# Patient Record
Sex: Female | Born: 1965 | Race: White | Hispanic: No | Marital: Married | Smoking: Never smoker
Health system: Southern US, Community
[De-identification: ages and names within clinical notes are randomized; demographics above are authoritative.]

---

## 2004-06-20 ENCOUNTER — Other Ambulatory Visit: Admission: RE | Admit: 2004-06-20 | Discharge: 2004-06-20 | Payer: Self-pay | Admitting: Gynecology

## 2005-07-29 ENCOUNTER — Other Ambulatory Visit: Admission: RE | Admit: 2005-07-29 | Discharge: 2005-07-29 | Payer: Self-pay | Admitting: Gynecology

## 2006-03-24 ENCOUNTER — Ambulatory Visit (HOSPITAL_COMMUNITY): Admission: RE | Admit: 2006-03-24 | Discharge: 2006-03-24 | Payer: Self-pay | Admitting: Gynecology

## 2007-03-29 ENCOUNTER — Other Ambulatory Visit: Admission: RE | Admit: 2007-03-29 | Discharge: 2007-03-29 | Payer: Self-pay | Admitting: Gynecology

## 2007-03-31 ENCOUNTER — Ambulatory Visit (HOSPITAL_COMMUNITY): Admission: RE | Admit: 2007-03-31 | Discharge: 2007-03-31 | Payer: Self-pay | Admitting: Gynecology

## 2008-03-31 ENCOUNTER — Ambulatory Visit (HOSPITAL_COMMUNITY): Admission: RE | Admit: 2008-03-31 | Discharge: 2008-03-31 | Payer: Self-pay | Admitting: Gynecology

## 2008-04-01 IMAGING — MG MM DIGITAL SCREENING BILAT
4 series · 4 of 4 positions shown · non-contrast
Comparison: none

DG SCREEN MAMMOGRAM BILATERAL
Bilateral CC and MLO view(s) were taken.

DIGITAL SCREENING MAMMOGRAM WITH CAD:
There is a fibroglandular pattern.  No masses or malignant type calcifications are identified.  
Compared with prior studies.

[R CC]
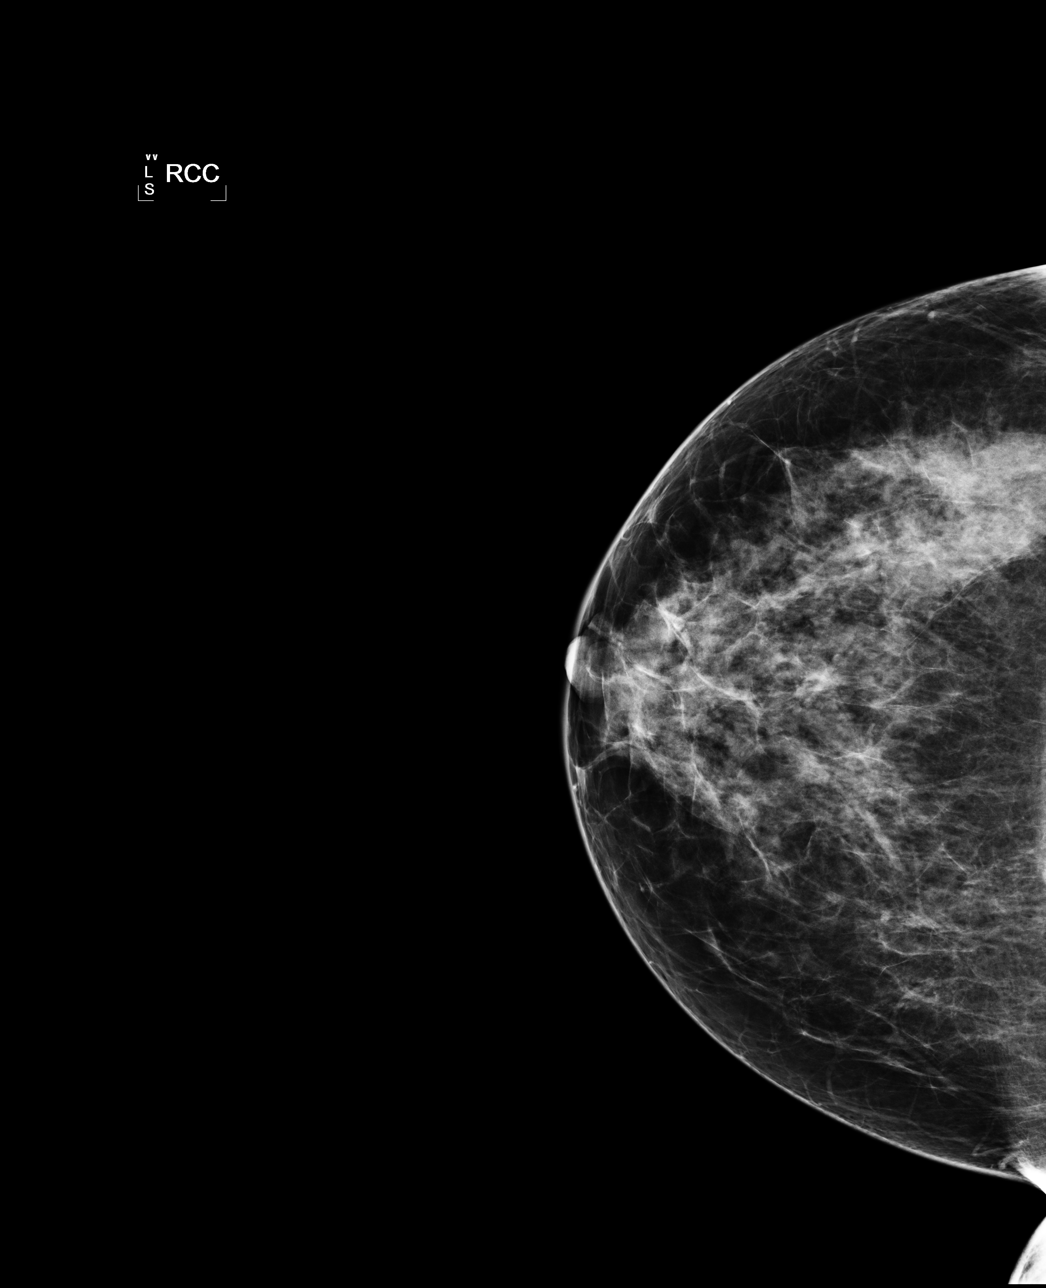

[R MLO]
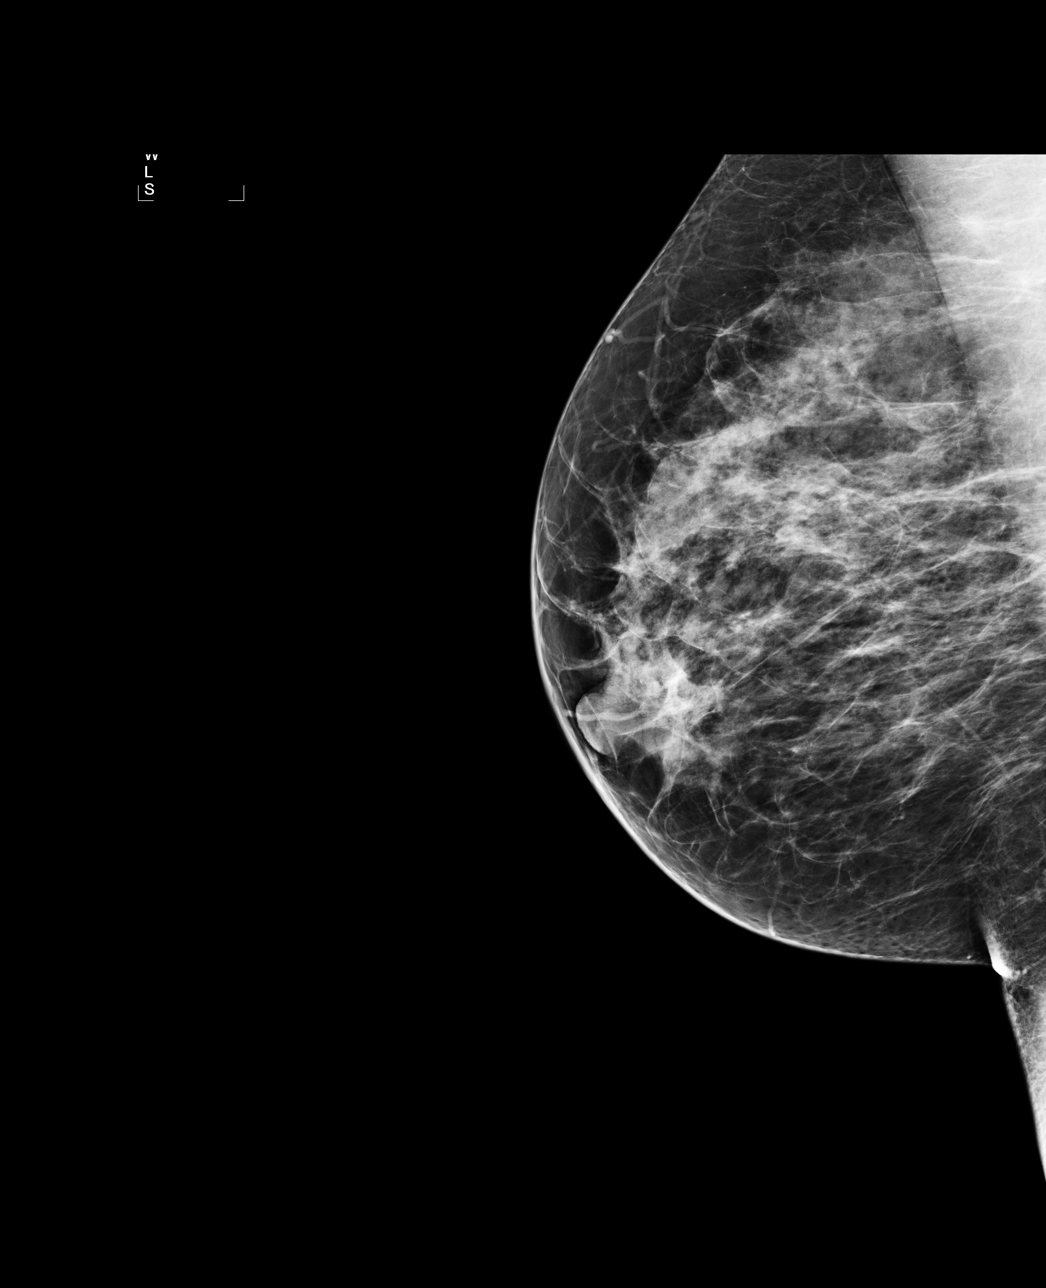

[L CC]
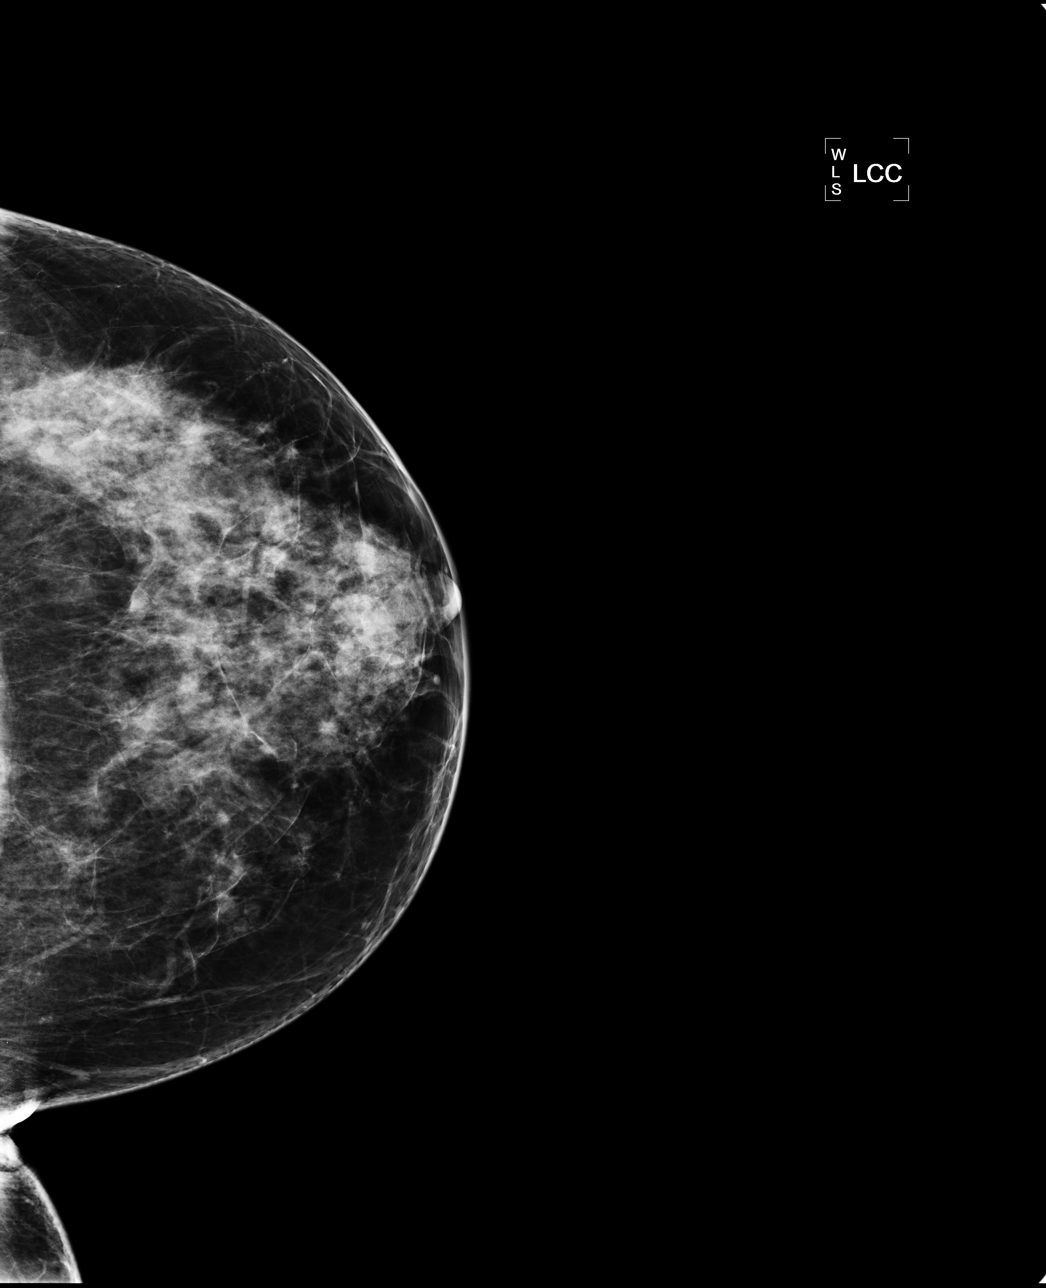

[L MLO]
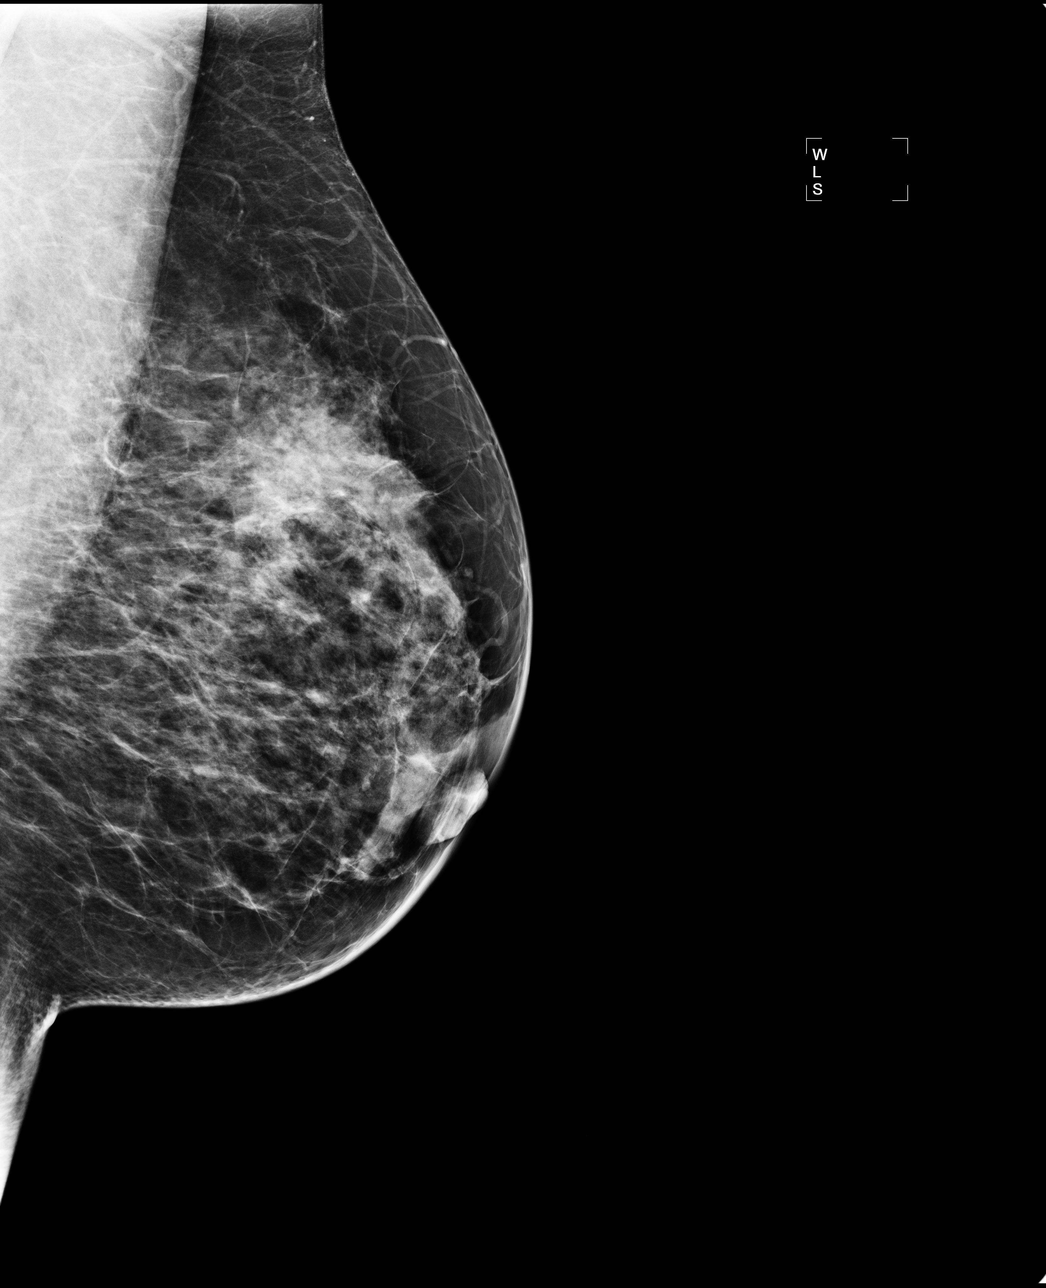

[4 of 4 positions shown; findings below may reference images not displayed]

IMPRESSION: No specific mammographic evidence of malignancy.  Next screening mammogram is recommended in one 
year.

ASSESSMENT: Negative - BI-RADS 1

Screening mammogram in 1 year.
ANALYZED BY COMPUTER AIDED DETECTION. , THIS PROCEDURE WAS A DIGITAL MAMMOGRAM.

## 2008-10-19 ENCOUNTER — Other Ambulatory Visit: Admission: RE | Admit: 2008-10-19 | Discharge: 2008-10-19 | Payer: Self-pay | Admitting: Gynecology

## 2009-04-05 ENCOUNTER — Ambulatory Visit (HOSPITAL_COMMUNITY): Admission: RE | Admit: 2009-04-05 | Discharge: 2009-04-05 | Payer: Self-pay | Admitting: Gynecology

## 2012-06-21 ENCOUNTER — Other Ambulatory Visit: Payer: Self-pay | Admitting: Gynecology

## 2012-06-21 DIAGNOSIS — R928 Other abnormal and inconclusive findings on diagnostic imaging of breast: Secondary | ICD-10-CM

## 2012-06-23 ENCOUNTER — Ambulatory Visit
Admission: RE | Admit: 2012-06-23 | Discharge: 2012-06-23 | Disposition: A | Discharge status: Home / Self Care | Payer: BC Managed Care – PPO | Source: Ambulatory Visit | Attending: Gynecology | Admitting: Gynecology

## 2012-06-23 DIAGNOSIS — R928 Other abnormal and inconclusive findings on diagnostic imaging of breast: Secondary | ICD-10-CM

## 2012-06-25 ENCOUNTER — Other Ambulatory Visit: Payer: Self-pay

## 2013-08-03 ENCOUNTER — Ambulatory Visit (INDEPENDENT_AMBULATORY_CARE_PROVIDER_SITE_OTHER): Payer: PRIVATE HEALTH INSURANCE | Admitting: General Surgery

## 2013-08-03 ENCOUNTER — Encounter (INDEPENDENT_AMBULATORY_CARE_PROVIDER_SITE_OTHER): Payer: Self-pay | Admitting: General Surgery

## 2013-08-03 VITALS — BP 120/76 | HR 77 | Temp 98.6°F | Resp 16 | Ht 60.0 in | Wt 139.6 lb

## 2013-08-03 DIAGNOSIS — K8020 Calculus of gallbladder without cholecystitis without obstruction: Secondary | ICD-10-CM

## 2013-08-03 NOTE — Progress Notes (Signed)
Subjective:     Patient ID: Kristin Barron, female   DOB: [birth year], 47 y.o.   MRN: [id number]  HPI The patient is a 47 year old female who is referred by Dr. Jacquelyne Balint. Patient is undergoing evaluation WBCs in her urine by her PCP. Patien was evaluated with a CT scan which revealed a dilated gallbladder and a gallstone.  The patient does describe she has left  Sided back pain.  She states she has no pain after eating. She states that fatty foods and not cause her to have right upper quadrant pain.  Review of Systems  Constitutional: Negative.   HENT: Negative.   Respiratory: Negative.   Cardiovascular: Negative.   Gastrointestinal: Negative.   All other systems reviewed and are negative.       Objective:   Physical Exam  Constitutional: She is oriented to person, place, and time. She appears well-developed and well-nourished.  HENT:  Head: Normocephalic and atraumatic.  Eyes: Conjunctivae and EOM are normal. Pupils are equal, round, and reactive to light.  Neck: Normal range of motion.  Cardiovascular: Normal rate, regular rhythm and normal heart sounds.   Pulmonary/Chest: Effort normal and breath sounds normal.  Abdominal: Soft. Bowel sounds are normal. There is tenderness in the right upper quadrant.  Musculoskeletal: Normal range of motion.  Neurological: She is alert and oriented to person, place, and time.       Assessment:     48 year old female with cholelithiasis     Plan:     1.  I do not believe that the patient's gallstones are causing any issues at this time. The patient has no symptoms of biliary colic. 2.  I Discussed with patient if  she has  pain  In her right upper quadrant after High fatty foods she can return for discussion of removing her gallbladder. I do not believe she needs at this time.

## 2013-08-15 ENCOUNTER — Encounter (INDEPENDENT_AMBULATORY_CARE_PROVIDER_SITE_OTHER): Payer: Self-pay

## 2014-04-18 ENCOUNTER — Other Ambulatory Visit: Payer: Self-pay

## 2014-04-18 DIAGNOSIS — Z1231 Encounter for screening mammogram for malignant neoplasm of breast: Secondary | ICD-10-CM

## 2014-06-28 ENCOUNTER — Ambulatory Visit
Admission: RE | Admit: 2014-06-28 | Discharge: 2014-06-28 | Disposition: A | Discharge status: Home / Self Care | Payer: PRIVATE HEALTH INSURANCE | Source: Ambulatory Visit

## 2014-06-28 DIAGNOSIS — Z1231 Encounter for screening mammogram for malignant neoplasm of breast: Secondary | ICD-10-CM

## 2019-04-29 ENCOUNTER — Other Ambulatory Visit: Payer: Self-pay

## 2019-04-29 ENCOUNTER — Other Ambulatory Visit: Payer: PRIVATE HEALTH INSURANCE

## 2019-04-29 DIAGNOSIS — Z20822 Contact with and (suspected) exposure to covid-19: Secondary | ICD-10-CM

## 2019-05-03 LAB — NOVEL CORONAVIRUS, NAA: SARS-CoV-2, NAA: NOT DETECTED

## 2019-05-04 ENCOUNTER — Telehealth: Payer: Self-pay

## 2019-05-04 NOTE — Telephone Encounter (Signed)
Pt returned call, advised of negative COVID19 results

## 2020-05-08 ENCOUNTER — Other Ambulatory Visit: Payer: Self-pay | Admitting: Obstetrics and Gynecology

## 2020-05-08 DIAGNOSIS — R928 Other abnormal and inconclusive findings on diagnostic imaging of breast: Secondary | ICD-10-CM

## 2020-05-18 ENCOUNTER — Ambulatory Visit
Admission: RE | Admit: 2020-05-18 | Discharge: 2020-05-18 | Disposition: A | Discharge status: Home / Self Care | Payer: PRIVATE HEALTH INSURANCE | Source: Ambulatory Visit | Attending: Obstetrics and Gynecology | Admitting: Obstetrics and Gynecology

## 2020-05-18 ENCOUNTER — Other Ambulatory Visit: Payer: Self-pay

## 2020-05-18 ENCOUNTER — Ambulatory Visit
Admission: RE | Admit: 2020-05-18 | Discharge: 2020-05-18 | Disposition: A | Discharge status: Home / Self Care | Payer: BC Managed Care – PPO | Source: Ambulatory Visit | Attending: Obstetrics and Gynecology | Admitting: Obstetrics and Gynecology

## 2020-05-18 DIAGNOSIS — R928 Other abnormal and inconclusive findings on diagnostic imaging of breast: Secondary | ICD-10-CM

## 2021-06-03 ENCOUNTER — Other Ambulatory Visit: Payer: Self-pay | Admitting: Obstetrics and Gynecology

## 2021-06-03 DIAGNOSIS — R928 Other abnormal and inconclusive findings on diagnostic imaging of breast: Secondary | ICD-10-CM

## 2021-06-20 ENCOUNTER — Ambulatory Visit
Admission: RE | Admit: 2021-06-20 | Discharge: 2021-06-20 | Disposition: A | Discharge status: Home / Self Care | Payer: PRIVATE HEALTH INSURANCE | Source: Ambulatory Visit | Attending: Obstetrics and Gynecology | Admitting: Obstetrics and Gynecology

## 2021-06-20 ENCOUNTER — Other Ambulatory Visit: Payer: Self-pay

## 2021-06-20 DIAGNOSIS — R928 Other abnormal and inconclusive findings on diagnostic imaging of breast: Secondary | ICD-10-CM

## 2022-06-22 IMAGING — US US BREAST*L* LIMITED INC AXILLA
1 series · 4 of 4 positions shown · non-contrast
Comparison: Previous exam(s).

CLINICAL DATA: Patient recalled from screening for left breast
mass.

EXAM:
DIGITAL DIAGNOSTIC UNILATERAL LEFT MAMMOGRAM WITH TOMOSYNTHESIS AND
CAD; ULTRASOUND LEFT BREAST LIMITED
TECHNIQUE: Left digital diagnostic mammography and breast tomosynthesis was
performed. The images were evaluated with computer-aided detection.;
Targeted ultrasound examination of the left breast was performed.

[Series 1: us breast*left* limited inc axilla · 0.06mm/px · 4 of 4 slices shown]
[im 1/4]
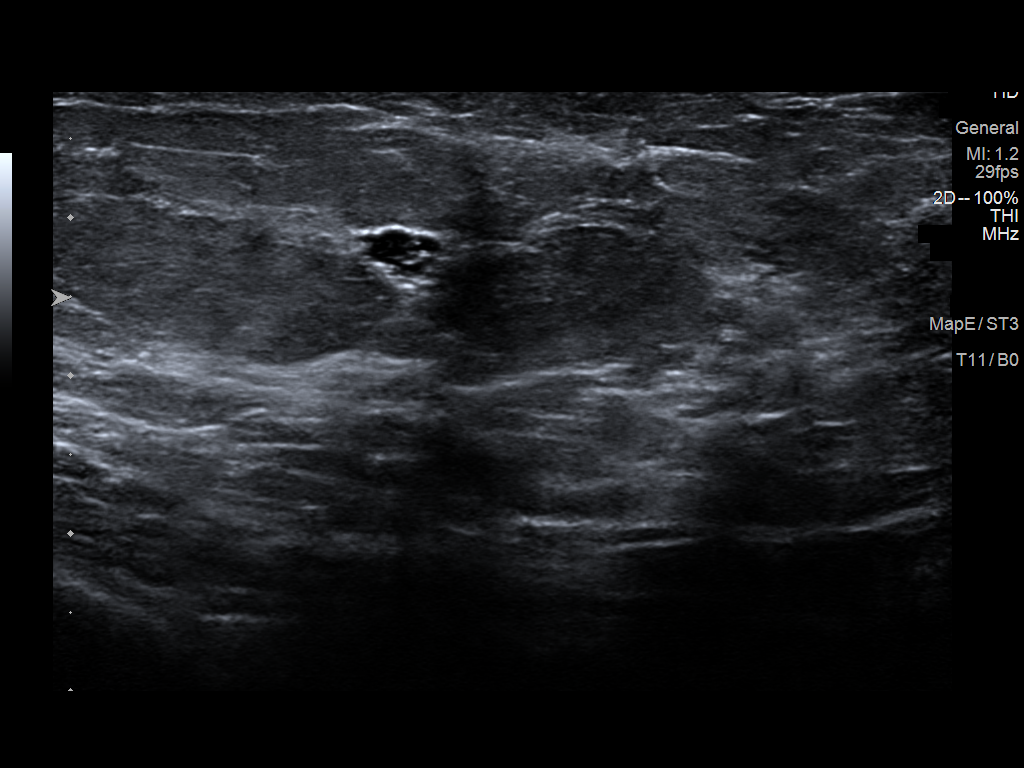
[im 2/4]
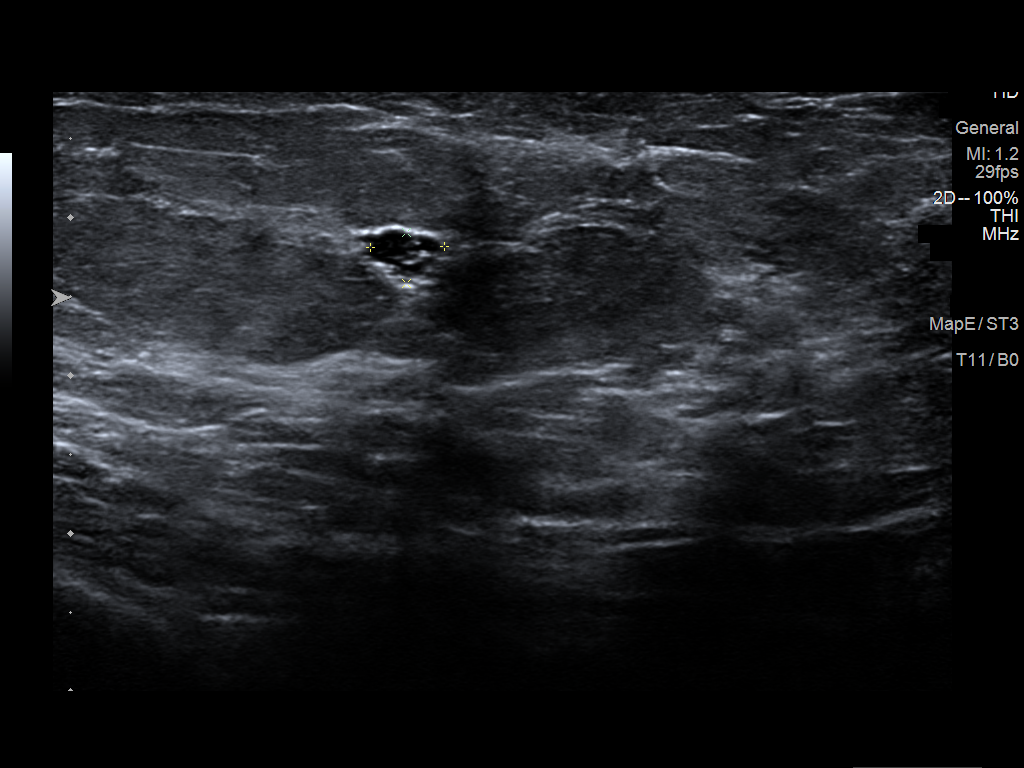
[im 3/4]
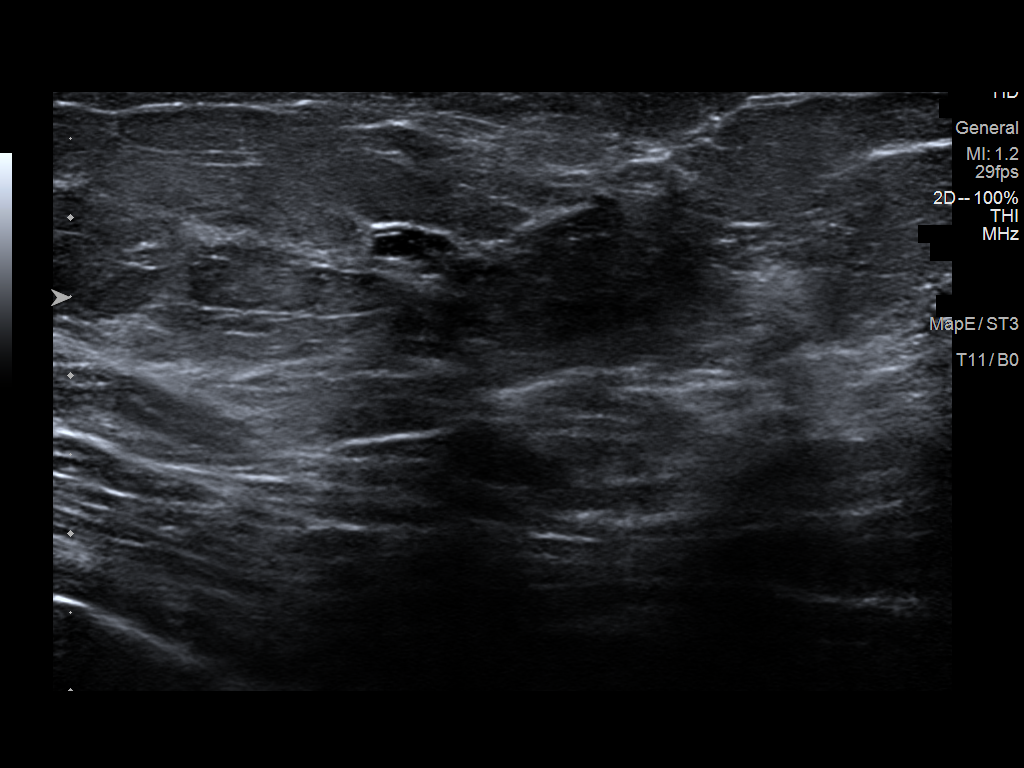
[im 4/4]
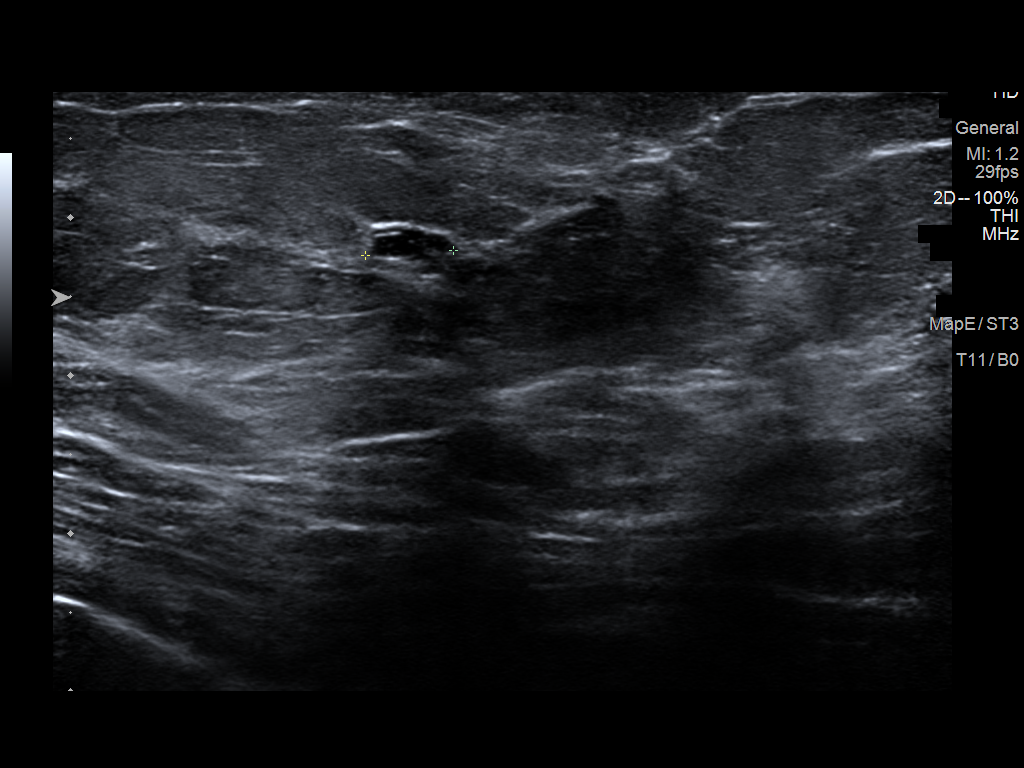

[4 of 4 positions shown; findings below may reference images not displayed]

ACR Breast Density Category c: The breast tissue is heterogeneously
dense, which may obscure small masses.
FINDINGS: Within the lower inner left breast anterior depth there is a
persistent lobular mass, further evaluated with additional imaging.

Targeted ultrasound is performed, showing a 5 x 3 x 6 mm cluster of
cysts left breast 8 o'clock position 3 cm from nipple.
IMPRESSION: Left breast cluster of cysts. No mammographic evidence for
malignancy.

RECOMMENDATION:
Screening mammogram in one year.(Code:KU-Q-LFA)

I have discussed the findings and recommendations with the patient.
If applicable, a reminder letter will be sent to the patient
regarding the next appointment.

BI-RADS CATEGORY  2: Benign.
